# Patient Record
Sex: Male | Born: 1970 | Race: White | Hispanic: No | Marital: Married | Smoking: Former smoker
Health system: Southern US, Community
[De-identification: ages and names within clinical notes are randomized; demographics above are authoritative.]

## PROBLEM LIST (undated history)

## (undated) DIAGNOSIS — M779 Enthesopathy, unspecified: Secondary | ICD-10-CM

## (undated) DIAGNOSIS — Z973 Presence of spectacles and contact lenses: Secondary | ICD-10-CM

## (undated) HISTORY — PX: WISDOM TOOTH EXTRACTION: SHX21

---

## 1997-01-15 HISTORY — PX: KNEE ARTHROSCOPY: SUR90

## 2011-05-22 ENCOUNTER — Other Ambulatory Visit: Payer: Self-pay | Admitting: Emergency Medicine

## 2011-05-22 ENCOUNTER — Ambulatory Visit
Admission: RE | Admit: 2011-05-22 | Discharge: 2011-05-22 | Disposition: A | Discharge status: Home / Self Care | Payer: Managed Care, Other (non HMO) | Source: Ambulatory Visit | Attending: Emergency Medicine | Admitting: Emergency Medicine

## 2011-05-22 DIAGNOSIS — R52 Pain, unspecified: Secondary | ICD-10-CM

## 2013-04-11 IMAGING — CR DG HIP COMPLETE 2+V*R*
2 series · 2 of 2 positions shown · non-contrast
Comparison: None.

CLINICAL DATA: Right groin and hip pain, no injury

RIGHT HIP - COMPLETE 2+ VIEW

[view not recorded (1 of 2)]
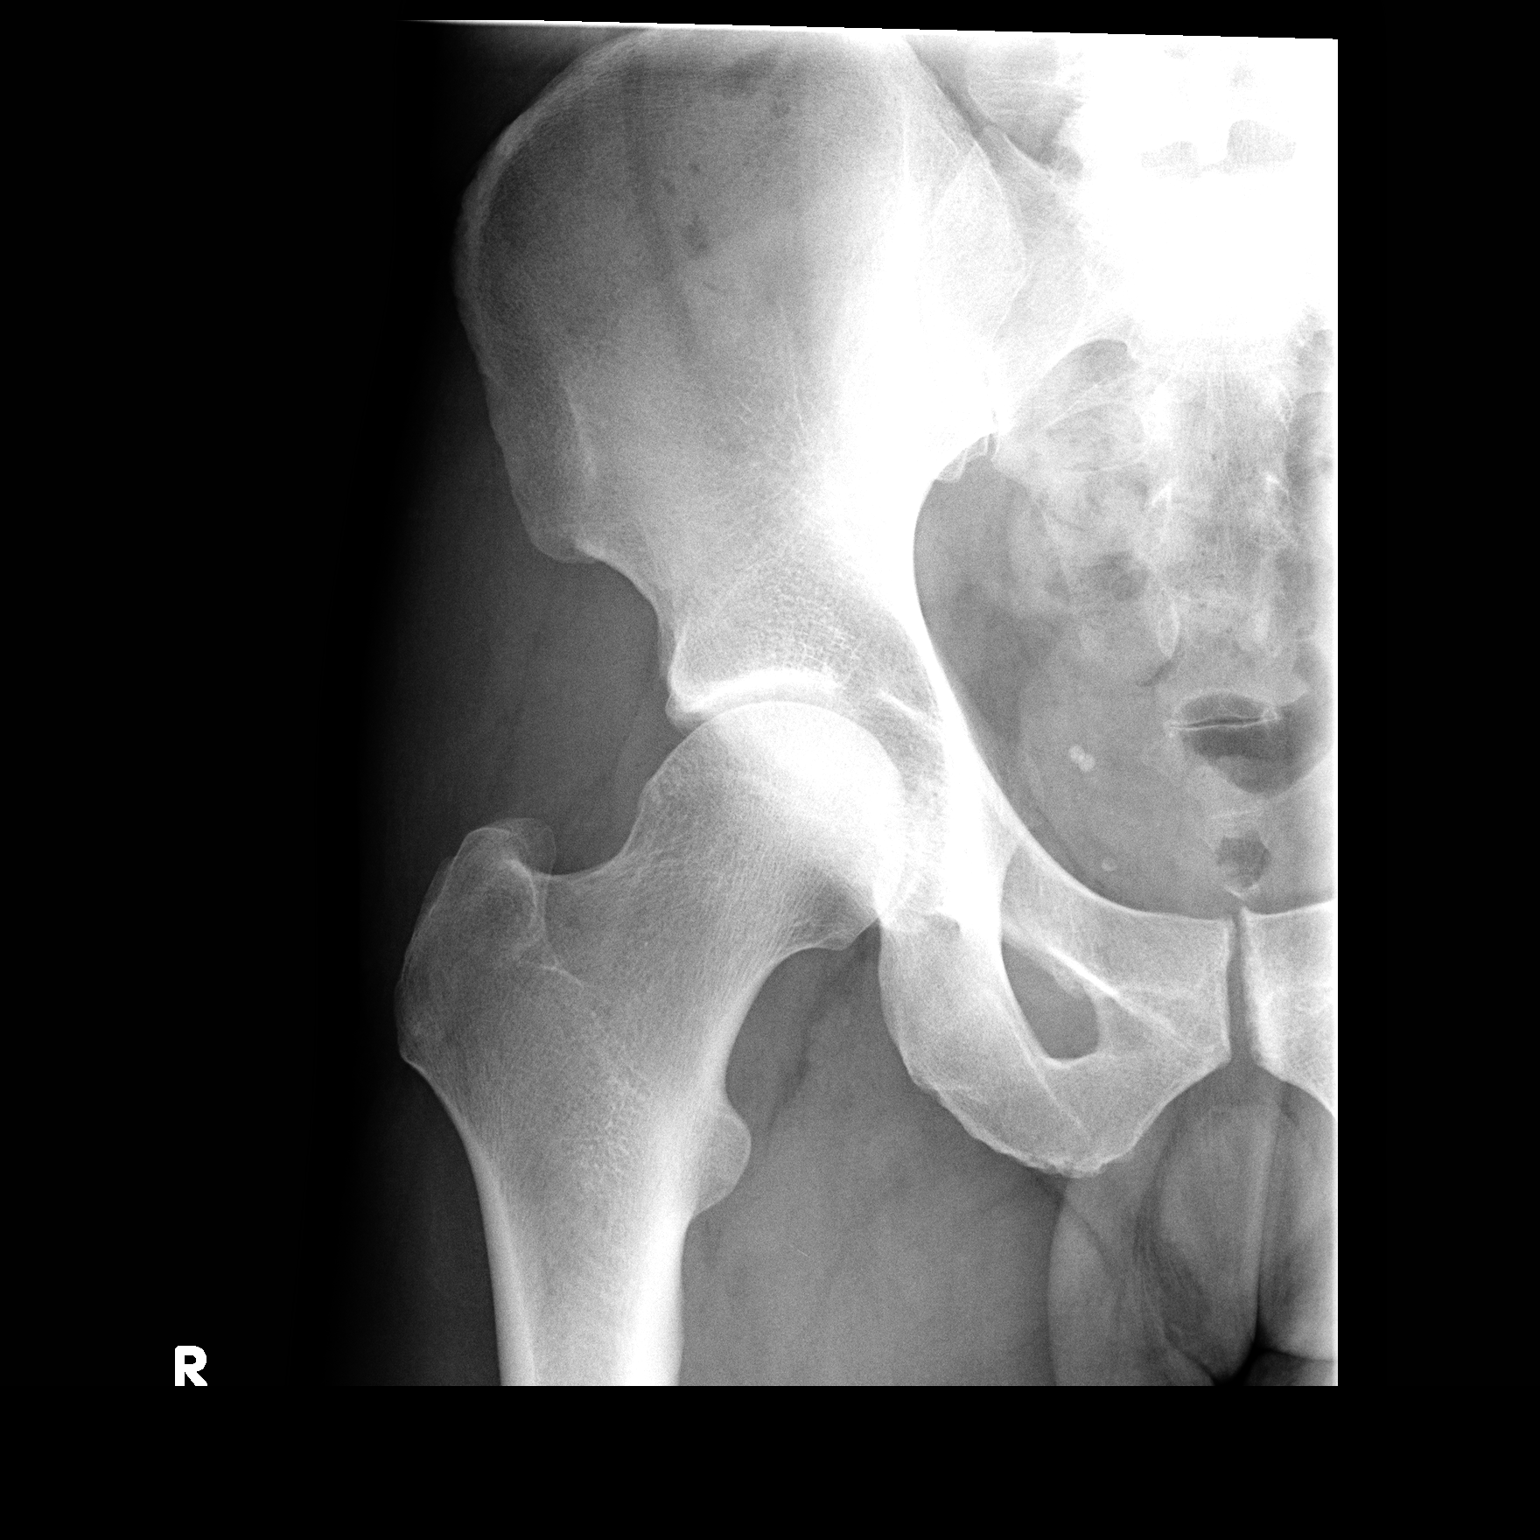

[view not recorded (2 of 2)]
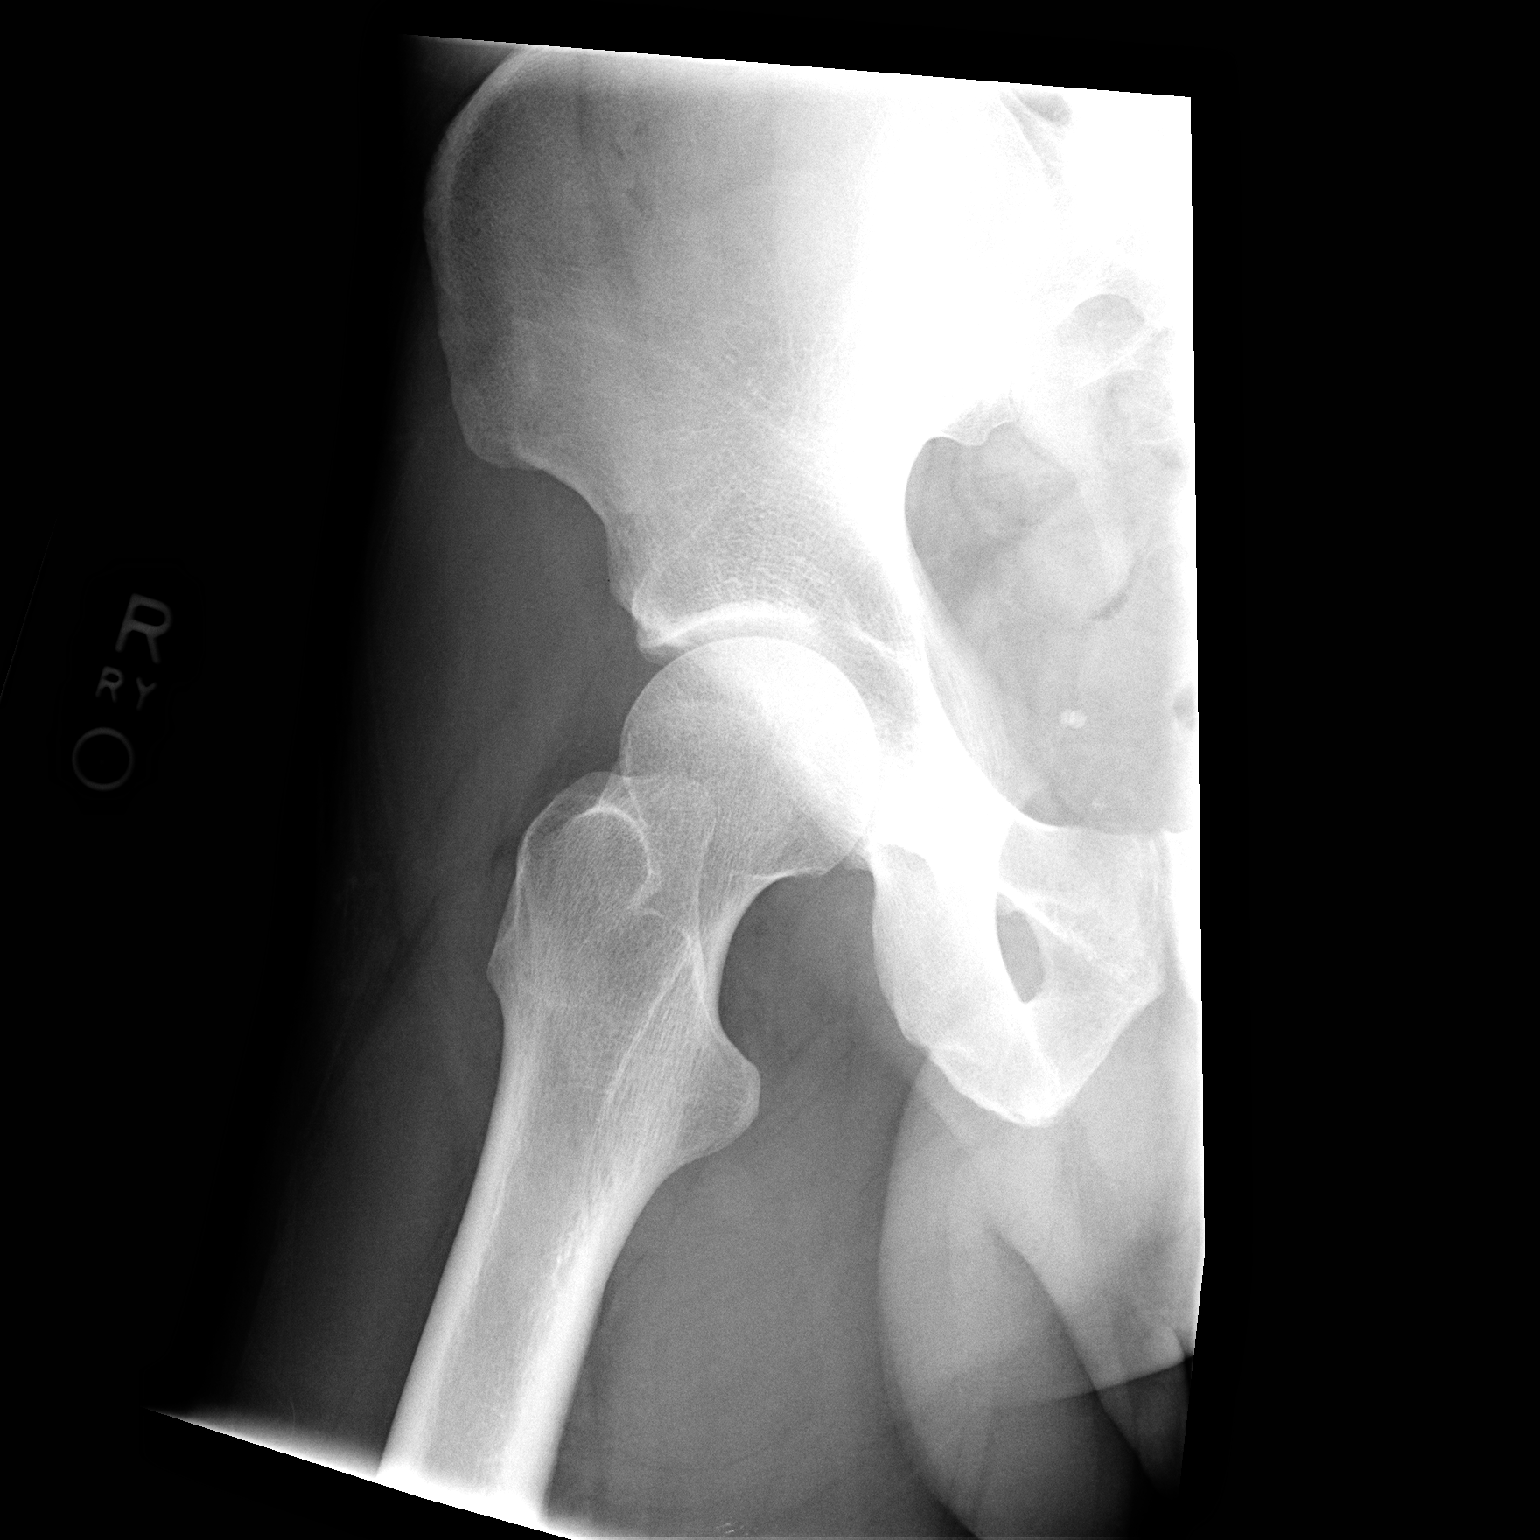

[2 of 2 positions shown; findings below may reference images not displayed]

FINDINGS: No acute bony abnormality is seen.  The right hip joint
space is relatively normal.  The right ramus is intact.  The right
SI joint appears normal.
IMPRESSION: Negative.

## 2015-11-16 DIAGNOSIS — R198 Other specified symptoms and signs involving the digestive system and abdomen: Secondary | ICD-10-CM | POA: Insufficient documentation

## 2015-11-16 DIAGNOSIS — R09A2 Foreign body sensation, throat: Secondary | ICD-10-CM | POA: Insufficient documentation

## 2015-11-16 DIAGNOSIS — R0989 Other specified symptoms and signs involving the circulatory and respiratory systems: Secondary | ICD-10-CM | POA: Insufficient documentation

## 2018-11-20 ENCOUNTER — Other Ambulatory Visit (HOSPITAL_COMMUNITY)
Admission: RE | Admit: 2018-11-20 | Discharge: 2018-11-20 | Disposition: A | Discharge status: Home / Self Care | Payer: Commercial Managed Care - PPO | Source: Ambulatory Visit | Attending: Podiatry | Admitting: Podiatry

## 2018-11-20 DIAGNOSIS — Z01812 Encounter for preprocedural laboratory examination: Secondary | ICD-10-CM | POA: Insufficient documentation

## 2018-11-20 DIAGNOSIS — Z20828 Contact with and (suspected) exposure to other viral communicable diseases: Secondary | ICD-10-CM | POA: Insufficient documentation

## 2018-11-21 ENCOUNTER — Encounter (HOSPITAL_BASED_OUTPATIENT_CLINIC_OR_DEPARTMENT_OTHER): Payer: Self-pay | Admitting: *Deleted

## 2018-11-21 ENCOUNTER — Other Ambulatory Visit: Payer: Self-pay

## 2018-11-21 NOTE — Progress Notes (Signed)
Spoke w/ via phone for pre-op interview--- PT Lab needs dos---- NONE COVID test ------ 11-20-2018 Arrive at ------- 1330 NPO after ------ MN w/ exception clear liquids until 0930 (no cream/ milk products) then nothing by mouth Medications to take morning of surgery ----- NONE Diabetic medication ----- n/a Patient Special Instructions ----- n/a  Pre-Op special Istructions -----  Have not received pt's pcp h&p yet from dr Corky Crafts office.  Pt stated he called [facility] in [facility] he [facility], stated they are waiting for lab results before faxing h&p to dr Mollie Germany and it could be today or Saturday.  When it comes will get to surgery center to place on chat.  Patient verbalized understanding of instructions that were given at this phone interview. Patient denies shortness of breath, chest pain, fever, cough a this phone interview.

## 2018-11-21 NOTE — Progress Notes (Signed)
Dr. Fritzi Mandes to modify consent order due to abbreviations.

## 2018-11-22 LAB — NOVEL CORONAVIRUS, NAA (HOSP ORDER, SEND-OUT TO REF LAB; TAT 18-24 HRS): SARS-CoV-2, NAA: NOT DETECTED

## 2018-11-24 ENCOUNTER — Encounter (HOSPITAL_BASED_OUTPATIENT_CLINIC_OR_DEPARTMENT_OTHER): Payer: Self-pay | Admitting: *Deleted

## 2018-11-24 ENCOUNTER — Ambulatory Visit (HOSPITAL_BASED_OUTPATIENT_CLINIC_OR_DEPARTMENT_OTHER): Payer: Commercial Managed Care - PPO | Admitting: Anesthesiology

## 2018-11-24 ENCOUNTER — Other Ambulatory Visit: Payer: Self-pay

## 2018-11-24 ENCOUNTER — Ambulatory Visit (HOSPITAL_BASED_OUTPATIENT_CLINIC_OR_DEPARTMENT_OTHER)
Admission: RE | Admit: 2018-11-24 | Discharge: 2018-11-24 | Disposition: A | Discharge status: Home / Self Care | Payer: Commercial Managed Care - PPO | Source: Ambulatory Visit | Attending: Podiatry | Admitting: Podiatry

## 2018-11-24 ENCOUNTER — Encounter (HOSPITAL_BASED_OUTPATIENT_CLINIC_OR_DEPARTMENT_OTHER)
Admission: RE | Disposition: A | Discharge status: Home / Self Care | Payer: Self-pay | Source: Ambulatory Visit | Attending: Podiatry

## 2018-11-24 DIAGNOSIS — Z79899 Other long term (current) drug therapy: Secondary | ICD-10-CM | POA: Insufficient documentation

## 2018-11-24 DIAGNOSIS — M205X1 Other deformities of toe(s) (acquired), right foot: Secondary | ICD-10-CM | POA: Diagnosis present

## 2018-11-24 DIAGNOSIS — Z87891 Personal history of nicotine dependence: Secondary | ICD-10-CM | POA: Diagnosis not present

## 2018-11-24 DIAGNOSIS — M7751 Other enthesopathy of right foot: Secondary | ICD-10-CM | POA: Diagnosis not present

## 2018-11-24 DIAGNOSIS — M25571 Pain in right ankle and joints of right foot: Secondary | ICD-10-CM | POA: Insufficient documentation

## 2018-11-24 HISTORY — DX: Presence of spectacles and contact lenses: Z97.3

## 2018-11-24 HISTORY — DX: Enthesopathy, unspecified: M77.9

## 2018-11-24 HISTORY — PX: CHILECTOMY: SHX6287

## 2018-11-24 SURGERY — CHILECTOMY
Anesthesia: Monitor Anesthesia Care | Site: Foot | Laterality: Right

## 2018-11-24 MED ORDER — OXYCODONE HCL 5 MG/5ML PO SOLN
5.0000 mg | Freq: Once | ORAL | Status: DC | PRN
  Filled 2018-11-24: qty 5

## 2018-11-24 MED ORDER — MIDAZOLAM HCL 2 MG/2ML IJ SOLN
INTRAMUSCULAR | Status: AC
  Filled 2018-11-24: qty 2

## 2018-11-24 MED ORDER — PROPOFOL 10 MG/ML IV BOLUS
INTRAVENOUS | Status: AC
  Filled 2018-11-24: qty 20

## 2018-11-24 MED ORDER — PROPOFOL 10 MG/ML IV BOLUS
INTRAVENOUS | Status: DC | PRN
  Administered 2018-11-24: 20 mg via INTRAVENOUS
  Administered 2018-11-24: 50 mg via INTRAVENOUS
  Administered 2018-11-24: 30 mg via INTRAVENOUS

## 2018-11-24 MED ORDER — CHLORHEXIDINE GLUCONATE CLOTH 2 % EX PADS
6.0000 | MEDICATED_PAD | Freq: Once | CUTANEOUS | Status: DC
  Filled 2018-11-24: qty 6

## 2018-11-24 MED ORDER — ONDANSETRON HCL 4 MG/2ML IJ SOLN
INTRAMUSCULAR | Status: DC | PRN
  Administered 2018-11-24: 4 mg via INTRAVENOUS

## 2018-11-24 MED ORDER — MIDAZOLAM HCL 2 MG/2ML IJ SOLN
2.0000 mg | Freq: Once | INTRAMUSCULAR | Status: AC
  Administered 2018-11-24: 2 mg via INTRAVENOUS
  Filled 2018-11-24: qty 2

## 2018-11-24 MED ORDER — MIDAZOLAM HCL 2 MG/2ML IJ SOLN
INTRAMUSCULAR | Status: DC | PRN
  Administered 2018-11-24: 2 mg via INTRAVENOUS

## 2018-11-24 MED ORDER — FENTANYL CITRATE (PF) 100 MCG/2ML IJ SOLN
100.0000 ug | Freq: Once | INTRAMUSCULAR | Status: AC
  Administered 2018-11-24: 14:00:00 100 ug via INTRAVENOUS
  Filled 2018-11-24: qty 2

## 2018-11-24 MED ORDER — OXYCODONE HCL 5 MG PO TABS
5.0000 mg | ORAL_TABLET | Freq: Once | ORAL | Status: DC | PRN
  Filled 2018-11-24: qty 1

## 2018-11-24 MED ORDER — PROMETHAZINE HCL 12.5 MG PO TABS
12.5000 mg | ORAL_TABLET | ORAL | Status: DC | PRN
  Filled 2018-11-24: qty 1

## 2018-11-24 MED ORDER — PROMETHAZINE HCL 25 MG/ML IJ SOLN
6.2500 mg | INTRAMUSCULAR | Status: DC | PRN
  Filled 2018-11-24: qty 1

## 2018-11-24 MED ORDER — PROPOFOL 500 MG/50ML IV EMUL
INTRAVENOUS | Status: DC | PRN
  Administered 2018-11-24: 100 ug/kg/min via INTRAVENOUS

## 2018-11-24 MED ORDER — DEXAMETHASONE SODIUM PHOSPHATE 10 MG/ML IJ SOLN
INTRAMUSCULAR | Status: DC | PRN
  Administered 2018-11-24: 4 mg via INTRAVENOUS

## 2018-11-24 MED ORDER — CEFAZOLIN SODIUM-DEXTROSE 2-4 GM/100ML-% IV SOLN
2.0000 g | INTRAVENOUS | Status: DC
  Filled 2018-11-24: qty 100

## 2018-11-24 MED ORDER — ACETAMINOPHEN 325 MG PO TABS
650.0000 mg | ORAL_TABLET | ORAL | Status: DC | PRN
  Filled 2018-11-24: qty 2

## 2018-11-24 MED ORDER — ACETAMINOPHEN 500 MG PO TABS
1000.0000 mg | ORAL_TABLET | Freq: Once | ORAL | Status: DC
  Filled 2018-11-24: qty 2

## 2018-11-24 MED ORDER — HYDROCODONE-ACETAMINOPHEN 5-325 MG PO TABS
1.0000 | ORAL_TABLET | ORAL | Status: DC | PRN
  Filled 2018-11-24: qty 1

## 2018-11-24 MED ORDER — FENTANYL CITRATE (PF) 100 MCG/2ML IJ SOLN
INTRAMUSCULAR | Status: AC
  Filled 2018-11-24: qty 2

## 2018-11-24 MED ORDER — SODIUM CHLORIDE 0.9% FLUSH
3.0000 mL | Freq: Two times a day (BID) | INTRAVENOUS | Status: DC
  Filled 2018-11-24: qty 3

## 2018-11-24 MED ORDER — FENTANYL CITRATE (PF) 100 MCG/2ML IJ SOLN
25.0000 ug | INTRAMUSCULAR | Status: DC | PRN
  Filled 2018-11-24: qty 1

## 2018-11-24 MED ORDER — CEFAZOLIN SODIUM-DEXTROSE 2-4 GM/100ML-% IV SOLN
2.0000 g | INTRAVENOUS | Status: AC
  Administered 2018-11-24: 2 g via INTRAVENOUS
  Filled 2018-11-24: qty 100

## 2018-11-24 MED ORDER — FENTANYL CITRATE (PF) 100 MCG/2ML IJ SOLN
INTRAMUSCULAR | Status: DC | PRN
  Administered 2018-11-24 (×2): 25 ug via INTRAVENOUS

## 2018-11-24 MED ORDER — LACTATED RINGERS IV SOLN
INTRAVENOUS | Status: DC
  Administered 2018-11-24 (×2): via INTRAVENOUS
  Filled 2018-11-24: qty 1000

## 2018-11-24 MED ORDER — CEFAZOLIN SODIUM-DEXTROSE 2-4 GM/100ML-% IV SOLN
INTRAVENOUS | Status: AC
  Filled 2018-11-24: qty 100

## 2018-11-24 MED ORDER — SODIUM CHLORIDE 0.9% FLUSH
3.0000 mL | INTRAVENOUS | Status: DC | PRN
  Filled 2018-11-24: qty 3

## 2018-11-24 MED ORDER — BUPIVACAINE HCL (PF) 0.5 % IJ SOLN
INTRAMUSCULAR | Status: DC | PRN
  Administered 2018-11-24: 23 mL via PERINEURAL

## 2018-11-24 MED ORDER — BUPIVACAINE LIPOSOME 1.3 % IJ SUSP
INTRAMUSCULAR | Status: DC | PRN
  Administered 2018-11-24: 10 mL

## 2018-11-24 SURGICAL SUPPLY — 62 items
BANDAGE CO FLEX L/F 2IN X 5YD (GAUZE/BANDAGES/DRESSINGS) ×3 IMPLANT
BANDAGE ELASTIC 6 VELCRO ST LF (GAUZE/BANDAGES/DRESSINGS) IMPLANT
BENZOIN TINCTURE PRP APPL 2/3 (GAUZE/BANDAGES/DRESSINGS) ×3 IMPLANT
BLADE AVERAGE 25MMX9MM (BLADE) ×1
BLADE AVERAGE 25X9 (BLADE) ×2 IMPLANT
BLADE OSC/SAG .038X5.5 CUT EDG (BLADE) IMPLANT
BLADE OSC/SAGITTAL MD 5.5X18 (BLADE) IMPLANT
BLADE OSCIL/SAGITTAL W/10 ST (BLADE) IMPLANT
BLADE OSCIL/SAGITTAL W/10MM ST (BLADE)
BLADE SURG 15 STRL LF DISP TIS (BLADE) ×3 IMPLANT
BLADE SURG 15 STRL SS (BLADE) ×6
BNDG COHESIVE 2X5 TAN NS LF (GAUZE/BANDAGES/DRESSINGS) ×2 IMPLANT
BNDG CONFORM 2 STRL LF (GAUZE/BANDAGES/DRESSINGS) ×3 IMPLANT
BNDG ESMARK 4X9 LF (GAUZE/BANDAGES/DRESSINGS) ×3 IMPLANT
CLOSURE WOUND 1/4 X3 (GAUZE/BANDAGES/DRESSINGS) ×1
CLOSURE WOUND 1/4X4 (GAUZE/BANDAGES/DRESSINGS) ×1
COVER BACK TABLE 60X90IN (DRAPES) ×3 IMPLANT
COVER MAYO STAND STRL (DRAPES) ×3 IMPLANT
COVER WAND RF STERILE (DRAPES) IMPLANT
DRAPE EXTREMITY T 121X128X90 (DISPOSABLE) ×3 IMPLANT
DRAPE SHEET LG 3/4 BI-LAMINATE (DRAPES) ×3 IMPLANT
DURAPREP 26ML APPLICATOR (WOUND CARE) ×3 IMPLANT
ELECT REM PT RETURN 9FT ADLT (ELECTROSURGICAL) ×3
ELECTRODE REM PT RTRN 9FT ADLT (ELECTROSURGICAL) ×1 IMPLANT
GAUZE SPONGE 4X4 12PLY STRL (GAUZE/BANDAGES/DRESSINGS) ×3 IMPLANT
GAUZE SPONGE 4X4 12PLY STRL LF (GAUZE/BANDAGES/DRESSINGS) ×3 IMPLANT
GAUZE XEROFORM 1X8 LF (GAUZE/BANDAGES/DRESSINGS) ×3 IMPLANT
GLOVE BIO SURGEON STRL SZ7.5 (GLOVE) ×3 IMPLANT
GLOVE INDICATOR 7.5 STRL GRN (GLOVE) ×3 IMPLANT
GOWN STRL REUS W/TWL LRG LVL3 (GOWN DISPOSABLE) ×3 IMPLANT
KIT TURNOVER CYSTO (KITS) ×3 IMPLANT
LOOP VESSEL MAXI BLUE (MISCELLANEOUS) IMPLANT
NDL SAFETY ECLIPSE 18X1.5 (NEEDLE) ×1 IMPLANT
NEEDLE 27GAX1X1/2 (NEEDLE) ×3 IMPLANT
NEEDLE HYPO 18GX1.5 SHARP (NEEDLE) ×2
NEEDLE HYPO 22GX1.5 SAFETY (NEEDLE) ×3 IMPLANT
NS IRRIG 500ML POUR BTL (IV SOLUTION) ×3 IMPLANT
PACK BASIN DAY SURGERY FS (CUSTOM PROCEDURE TRAY) ×3 IMPLANT
PADDING CAST ABS 4INX4YD NS (CAST SUPPLIES)
PADDING CAST ABS COTTON 4X4 ST (CAST SUPPLIES) IMPLANT
PENCIL BUTTON HOLSTER BLD 10FT (ELECTRODE) ×3 IMPLANT
RASP BONE SMALL TEAR RASP (RASP) ×3 IMPLANT
SPONGE LAP 4X18 RFD (DISPOSABLE) ×3 IMPLANT
STOCKINETTE 6  STRL (DRAPES) ×2
STOCKINETTE 6 STRL (DRAPES) ×1 IMPLANT
STRIP CLOSURE SKIN 1/4X3 (GAUZE/BANDAGES/DRESSINGS) ×2 IMPLANT
STRIP CLOSURE SKIN 1/4X4 (GAUZE/BANDAGES/DRESSINGS) ×2 IMPLANT
SUCTION FRAZIER HANDLE 10FR (MISCELLANEOUS) ×2
SUCTION TUBE FRAZIER 10FR DISP (MISCELLANEOUS) ×1 IMPLANT
SUT ETHILON 4 0 PS 2 18 (SUTURE) IMPLANT
SUT ETHILON 5 0 PS 2 18 (SUTURE) IMPLANT
SUT MNCRL AB 4-0 PS2 18 (SUTURE) ×3 IMPLANT
SUT VIC AB 3-0 SH 27 (SUTURE) ×2
SUT VIC AB 3-0 SH 27X BRD (SUTURE) ×1 IMPLANT
SUT VICRYL 4-0 PS2 18IN ABS (SUTURE) ×3 IMPLANT
SYR 3ML 18GX1 1/2 (SYRINGE) ×3 IMPLANT
SYR BULB 3OZ (MISCELLANEOUS) ×3 IMPLANT
SYR CONTROL 10ML LL (SYRINGE) ×6 IMPLANT
TUBE CONNECTING 12'X1/4 (SUCTIONS) ×1
TUBE CONNECTING 12X1/4 (SUCTIONS) ×2 IMPLANT
UNDERPAD 30X30 (UNDERPADS AND DIAPERS) ×3 IMPLANT
WATER STERILE IRR 500ML POUR (IV SOLUTION) ×3 IMPLANT

## 2018-11-24 NOTE — Discharge Instructions (Signed)
°  Post Anesthesia Home Care Instructions  Activity: Get plenty of rest for the remainder of the day. A responsible individual must stay with you for 24 hours following the procedure.  For the next 24 hours, DO NOT: -Drive a car -Paediatric nurse -Drink alcoholic beverages -Take any medication unless instructed by your physician -Make any legal decisions or sign important papers.  Meals: Start with liquid foods such as gelatin or soup. Progress to regular foods as tolerated. Avoid greasy, spicy, heavy foods. If nausea and/or vomiting occur, drink only clear liquids until the nausea and/or vomiting subsides. Call your physician if vomiting continues.  Special Instructions/Symptoms: Your throat may feel dry or sore from the anesthesia or the breathing tube placed in your throat during surgery. If this causes discomfort, gargle with warm salt water. The discomfort should disappear within 24 hours.  Regional Anesthesia Blocks  1. Numbness or the inability to move the "blocked" extremity may last from 3-48 hours after placement. The length of time depends on the medication injected and your individual response to the medication. If the numbness is not going away after 48 hours, call your surgeon.  2. The extremity that is blocked will need to be protected until the numbness is gone and the  Strength has returned. Because you cannot feel it, you will need to take extra care to avoid injury. Because it may be weak, you may have difficulty moving it or using it. You may not know what position it is in without looking at it while the block is in effect.  3. For blocks in the legs and feet, returning to weight bearing and walking needs to be done carefully. You will need to wait until the numbness is entirely gone and the strength has returned. You should be able to move your leg and foot normally before you try and bear weight or walk. You will need someone to be with you when you first try to ensure  you do not fall and possibly risk injury.  4. Bruising and tenderness at the needle site are common side effects and will resolve in a few days.  5. Persistent numbness or new problems with movement should be communicated to the surgeon or the [place] [phone] [place] [phone]).    Do not remove surgical dressing. Reinforce surgical dressing PRN with Kerlix.   Elevate the operative extremity on two pillows at all times.   Non weight bearing lower extremity using a walker or crutches

## 2018-11-24 NOTE — Progress Notes (Signed)
Assisted Dr. Howze with right, ultrasound guided, ankle block. Side rails up, monitors on throughout procedure. See vital signs in flow sheet. Tolerated Procedure well. 

## 2018-11-24 NOTE — Pre-Procedure Instructions (Signed)
Procedure reviewed with the patient.  There is no contraindication to surgery at this time.  All questions answered.  No guarantees given.

## 2018-11-24 NOTE — Anesthesia Procedure Notes (Signed)
Anesthesia Regional Block: Ankle block   Pre-Anesthetic Checklist: ,, timeout performed, Correct Patient, Correct Site, Correct Laterality, Correct Procedure, Correct Position, site marked, Risks and benefits discussed, pre-op evaluation,  At surgeon's request and post-op pain management  Laterality: Right  Prep: Maximum Sterile Barrier Precautions used, chloraprep       Needles:  Injection technique: Single-shot  Needle Type: Echogenic Needle     Needle Length: 4cm  Needle Gauge: 25     Additional Needles:   Narrative:  Start time: 11/24/2018 2:27 PM End time: 11/24/2018 2:30 PM  Performed by: Personally  Anesthesiologist: Brennan Bailey, MD  Additional Notes: Risks, benefits, and alternative discussed. Patient gave consent for procedure. Patient prepped and draped in sterile fashion. Sedation administered, patient remains easily responsive to voice. Local anesthetic given in 5cc increments with no signs or symptoms of intravascular injection. No pain or paraesthesias with injection. Patient monitored throughout procedure with signs of LAST or immediate complications. Tolerated well.   Tawny Asal, MD

## 2018-11-24 NOTE — Anesthesia Preprocedure Evaluation (Addendum)
Anesthesia Evaluation  Patient identified by MRN, date of birth, ID band Patient awake    Reviewed: Allergy & Precautions, NPO status , Patient's Chart, lab work & pertinent test results  History of Anesthesia Complications Negative for: history of anesthetic complications  Airway Mallampati: II  TM Distance: >3 FB Neck ROM: Full    Dental no notable dental hx.    Pulmonary former smoker,    Pulmonary exam normal        Cardiovascular negative cardio ROS Normal cardiovascular exam     Neuro/Psych negative neurological ROS  negative psych ROS   GI/Hepatic negative GI ROS, Neg liver ROS,   Endo/Other  negative endocrine ROS  Renal/GU negative Renal ROS  negative genitourinary   Musculoskeletal negative musculoskeletal ROS (+)   Abdominal   Peds  Hematology negative hematology ROS (+)   Anesthesia Other Findings Day of surgery medications reviewed with patient.  Reproductive/Obstetrics negative OB ROS                            Anesthesia Physical Anesthesia Plan  ASA: I  Anesthesia Plan: Regional and MAC   Post-op Pain Management:    Induction:   PONV Risk Score and Plan: 1 and Treatment may vary due to age or medical condition, Propofol infusion, Ondansetron and Midazolam  Airway Management Planned: Natural Airway and Nasal Cannula  Additional Equipment: None  Intra-op Plan:   Post-operative Plan:   Informed Consent: I have reviewed the patients History and Physical, chart, labs and discussed the procedure including the risks, benefits and alternatives for the proposed anesthesia with the patient or authorized representative who has indicated his/her understanding and acceptance.       Plan Discussed with: CRNA  Anesthesia Plan Comments:       Anesthesia Quick Evaluation

## 2018-11-24 NOTE — Anesthesia Postprocedure Evaluation (Signed)
Anesthesia Post Note  Patient: Alexander Cruz  Procedure(s) Performed: CHILECTOMY (Right Foot)     Patient location during evaluation: PACU Anesthesia Type: Regional and MAC Level of consciousness: awake and alert Pain management: pain level controlled Vital Signs Assessment: post-procedure vital signs reviewed and stable Respiratory status: spontaneous breathing, nonlabored ventilation and respiratory function stable Cardiovascular status: stable and blood pressure returned to baseline Postop Assessment: no apparent nausea or vomiting Anesthetic complications: no    Last Vitals:  Vitals:   11/24/18 1645 11/24/18 1700  BP: 121/86 118/82  Pulse: 66 76  Resp: 14 14  Temp:    SpO2: 97% 98%    Last Pain:  Vitals:   11/24/18 1645  TempSrc:   PainSc: 0-No pain                 Eowyn Tabone,W. EDMOND

## 2018-11-24 NOTE — Anesthesia Procedure Notes (Signed)
Procedure Name: MAC Date/Time: 11/24/2018 3:43 PM Performed by: Justice Rocher, CRNA Pre-anesthesia Checklist: Patient identified, Emergency Drugs available, Suction available, Patient being monitored and Timeout performed Patient Re-evaluated:Patient Re-evaluated prior to induction Oxygen Delivery Method: Simple face mask Preoxygenation: Pre-oxygenation with 100% oxygen Induction Type: IV induction Placement Confirmation: breath sounds checked- equal and bilateral and positive ETCO2

## 2018-11-24 NOTE — Anesthesia Procedure Notes (Signed)
Procedure Name: MAC Date/Time: 11/24/2018 3:11 PM Performed by: Justice Rocher, CRNA Pre-anesthesia Checklist: Patient identified, Emergency Drugs available, Suction available, Patient being monitored and Timeout performed Patient Re-evaluated:Patient Re-evaluated prior to induction Oxygen Delivery Method: Nasal cannula Preoxygenation: Pre-oxygenation with 100% oxygen Induction Type: IV induction Placement Confirmation: breath sounds checked- equal and bilateral and positive ETCO2

## 2018-11-24 NOTE — Transfer of Care (Signed)
  Last Vitals:  Vitals Value Taken Time  BP    Temp    Pulse 80 11/24/18 1625  Resp 17 11/24/18 1625  SpO2 100 % 11/24/18 1625  Vitals shown include unvalidated device data.  Last Pain:  Vitals:   11/24/18 1258  TempSrc: Oral         Immediate Anesthesia Transfer of Care Note  Patient: Alexander Cruz  Procedure(s) Performed: Procedure(s) (LRB): CHILECTOMY (Right)  Patient Location: PACU  Anesthesia Type: General  Level of Consciousness: awake, alert  and oriented  Airway & Oxygen Therapy: Patient Spontanous Breathing and Patient connected to face mask oxygen  Post-op Assessment: Report given to PACU RN and Post -op Vital signs reviewed and stable  Post vital signs: Reviewed and stable  Complications: No apparent anesthesia complications

## 2018-11-25 ENCOUNTER — Encounter (HOSPITAL_BASED_OUTPATIENT_CLINIC_OR_DEPARTMENT_OTHER): Payer: Self-pay | Admitting: Podiatry

## 2018-11-25 NOTE — Op Note (Signed)
Alexander Cruz, HOEFFNER MEDICAL RECORD [id number] ACCOUNT [id number] DATE OF BIRTH:September 07, 1970 FACILITY: [facility] LOCATION: [facility] PHYSICIAN:Donisha Hoch, DPM  OPERATIVE REPORT  DATE OF PROCEDURE:  11/24/2018  SURGEON:  Rosemary Holms, DPM  ASSISTANT:  None.  PREOPERATIVE DIAGNOSIS:  Hallux limitus, right foot.  POSTOPERATIVE DIAGNOSIS:  Hallux limitus, right foot.  PROCEDURE:  Cheilectomy, right foot.  ANESTHESIA:  MAC with local.  COMPLICATIONS:  None.  DESCRIPTION OF PROCEDURE:  The patient was brought to the OR and placed in the supine position, at which time monitored anesthesia care was administered.  An ankle block was performed preoperatively by the anesthesiologist.  A well-padded pneumatic ankle  tourniquet was applied superior to the medial malleolus.  The patient was prepped and draped in the usual aseptic manner.  The foot was extenuated with an Esmarch bandage and the previously applied tourniquet inflated to 250 mmHg.  Attention was  directed to the first ray where a dorsal linear incision was made.  The incision was deepened via sharp and blunt modalities, taking care to clamp and cauterize all bleeding vessels and ensuring retraction of neurovascular structures encountered.  Deep  and superficial fascia was separated medially and laterally the length of the incision.  A linear capsulotomy was performed and the capsule freed from the head of the first metatarsal and base of the proximal phalanx.  At that time, a joint mouse was  identified, which I was able to excise.  There was a moderate amount of hypertrophic bone lipping at the head of the first metatarsal and base of the proximal phalanx, which was all resected using a power saw.  The medial eminence was resected using a  power saw.  The first MTP joint was evaluated.  There was a moderate amount of erosion in approximately the dorsal third of the head of the first metatarsal had erosion and a 0.045 K-wire was used  to fenestrate the erosion.  All rough edges were smoothed  with a power rasp.  The area was irrigated with copious amounts of sterile saline and antibiotic solution.  Deep closure was accomplished with 3-0 and 4-0 Vicryl and skin closure was accomplished with 4-0 Monocryl in a running subcuticular fashion.  The  foot was dressed with Steri-Strips, Xeroform, 4 x 4's, Kling and Coban.  The tourniquet was deflated and vascular status returned to all digits.  The patient was sent to the recovery room with vital signs stable and capillary refill time presurgical  levels.  Both written and oral postoperative instructions were given to the patient.  No guarantees given.  TN/NUANCE  D:11/24/2018 T:11/25/2018 JOB:[id number]

## 2020-07-07 ENCOUNTER — Ambulatory Visit (INDEPENDENT_AMBULATORY_CARE_PROVIDER_SITE_OTHER): Payer: Commercial Managed Care - PPO

## 2020-07-07 ENCOUNTER — Other Ambulatory Visit: Payer: Self-pay

## 2020-07-07 ENCOUNTER — Ambulatory Visit: Payer: Commercial Managed Care - PPO | Admitting: Podiatry

## 2020-07-07 DIAGNOSIS — T7840XA Allergy, unspecified, initial encounter: Secondary | ICD-10-CM | POA: Insufficient documentation

## 2020-07-07 DIAGNOSIS — M205X1 Other deformities of toe(s) (acquired), right foot: Secondary | ICD-10-CM

## 2020-07-07 DIAGNOSIS — M778 Other enthesopathies, not elsewhere classified: Secondary | ICD-10-CM

## 2020-07-07 DIAGNOSIS — F419 Anxiety disorder, unspecified: Secondary | ICD-10-CM | POA: Insufficient documentation

## 2020-07-07 DIAGNOSIS — B009 Herpesviral infection, unspecified: Secondary | ICD-10-CM | POA: Insufficient documentation

## 2020-07-10 NOTE — Progress Notes (Signed)
  Subjective:  Patient ID: Alexander Cruz, male    DOB: 10/18/1970,  MRN: [id number] HPI Chief Complaint  Patient presents with   Foot Pain    Right 1st and sub 1st inflammation, pt states it reduces his range of motion and limits exercise. History of bone spur removal.   Nail Problem    Pt states nail fungus 1-5 bilateral 10 year duration.    50 y.o. male presents with the above complaint.   ROS: Denies fever chills nausea vomiting muscle aches pains calf pain back pain chest pain shortness of breath.  Past Medical History:  Diagnosis Date   Bone spur    right foot   Wears glasses    Past Surgical History:  Procedure Laterality Date   CHILECTOMY Right 11/24/2018   Procedure: CHILECTOMY;  Surgeon: Larey Dresser, DPM;  Location: [facility];  Service: Podiatry;  Laterality: Right;   KNEE ARTHROSCOPY Right 1999   WISDOM TOOTH EXTRACTION      Current Outpatient Medications:    acyclovir (ZOVIRAX) 200 MG capsule, Take 200 mg by mouth daily in the afternoon., Disp: , Rfl:    acyclovir (ZOVIRAX) 200 MG capsule, 1 tablet, Disp: , Rfl:    EPINEPHrine 0.3 mg/0.3 mL IJ SOAJ injection, See admin instructions., Disp: , Rfl:    escitalopram (LEXAPRO) 10 MG tablet, 1 tablet, Disp: , Rfl:    methocarbamol (ROBAXIN) 500 MG tablet, 1-2 tablets as needed for muscle pain, Disp: , Rfl:   Allergies  Allergen Reactions   Yellow Jacket Venom Other (See Comments)    Other   Review of Systems Objective:  There were no vitals filed for this visit.  General: Well developed, nourished, in no acute distress, alert and oriented x3   Dermatological: Skin is warm, dry and supple bilateral. Nails x 10 are well maintained; remaining integument appears unremarkable at this time. There are no open sores, no preulcerative lesions, no rash or signs of infection present.  Vascular: Dorsalis Pedis artery and Posterior Tibial artery pedal pulses are 2/4 bilateral with immedate capillary fill  time. Pedal hair growth present. No varicosities and no lower extremity edema present bilateral.   Neruologic: Grossly intact via light touch bilateral. Vibratory intact via tuning fork bilateral. Protective threshold with Semmes Wienstein monofilament intact to all pedal sites bilateral. Patellar and Achilles deep tendon reflexes 2+ bilateral. No Babinski or clonus noted bilateral.   Musculoskeletal: No gross boney pedal deformities bilateral. No pain, crepitus, or limitation noted with foot and ankle range of motion bilateral. Muscular strength 5/5 in all groups tested bilateral.  Decreased range of motion of the first metatarsophalangeal joint right foot.  No spurs on palpation.  He does however have pain on palpation of the sesamoidal apparatus.  Gait: Unassisted, Nonantalgic.    Radiographs:  Radiographs taken today demonstrate an osseously mature individual.  No acute findings are noted.  Chronic finding consisting first metatarsophalangeal joint osteoarthritic changes and obvious dorsal exostectomy condylectomy has been performed.  The joint is rectus there is joint space narrowing subchondral sclerosis no further spurring noted.  Assessment & Plan:   Assessment: Hallux limitus.  Osteoarthritis first metatarsophalangeal joint right foot.  Sesamoiditis right foot.  Plan: Discussed etiology pathology conservative versus surgical therapies.  Discussed the use of either fusion for this joint or a arthroplasty of the joint.  He understands this is amendable to it I will follow-up with him on an as-needed basis.     Alexander Ashworth T. [place], [place]

## 2021-04-25 ENCOUNTER — Ambulatory Visit (INDEPENDENT_AMBULATORY_CARE_PROVIDER_SITE_OTHER): Payer: Commercial Managed Care - PPO

## 2021-04-25 ENCOUNTER — Encounter: Payer: Self-pay | Admitting: Podiatry

## 2021-04-25 ENCOUNTER — Ambulatory Visit (INDEPENDENT_AMBULATORY_CARE_PROVIDER_SITE_OTHER): Payer: Commercial Managed Care - PPO | Admitting: Podiatry

## 2021-04-25 DIAGNOSIS — M205X1 Other deformities of toe(s) (acquired), right foot: Secondary | ICD-10-CM | POA: Diagnosis not present

## 2021-04-25 NOTE — Progress Notes (Signed)
He presents today for follow-up of his osteoarthritis first metatarsophalangeal joint of his right foot.  He states that the foot is continued to hurt and he would like to discuss joint replacement of the first metatarsal phalangeal joint.  He would also like to have a quote for the cost of the surgery anesthesia and surgery center.  He states that he is done some research on the joint replacements and feels that it would most likely be better for him than a fusion. ? ?Objective: Vital signs are stable he is alert and oriented x3.  Pulses are palpable.  Neurologic sensorium is intact muscle strength is normal right foot.  He has limited range of motion in the rectus hallux first metatarsal phalangeal joint on dorsiflexion and plantarflexion.  This has been previously corrected with a condylectomy several years ago. ? ?Radiographs taken today demonstrate similar findings as to those we saw last time with significant joint space narrowing no subchondral sclerosis eburnation and no spurring.  Sesamoids appear to be in good position and not arthritic. ? ?Assessment osteoarthritis hallux limitus first metatarsophalangeal joint of the right foot. ? ?Plan: We discussed thoroughly today surgery and the [facility] arthroplasty with single silicone implant versus other 2 piece implants or fusion.  We consented him today for a Keller implant with silicone implant.  He understands this and is amenable to it we did discuss possible postop complications which may include but not limited to postop pain bleeding swelling infection recurrence need for further surgery overcorrection under correction also digit loss limb loss of life.  Follow-up with him in the near future for surgical intervention. ?

## 2021-10-19 ENCOUNTER — Ambulatory Visit: Payer: Commercial Managed Care - PPO | Admitting: Podiatry

## 2021-10-19 DIAGNOSIS — B351 Tinea unguium: Secondary | ICD-10-CM | POA: Diagnosis not present

## 2021-10-19 MED ORDER — TERBINAFINE HCL 250 MG PO TABS: 250.0000 mg | ORAL_TABLET | Freq: Every day | ORAL | 0 refills | Status: DC

## 2021-10-19 NOTE — Progress Notes (Signed)
He presents today as interested in fungal nail treatment has been tested by a doctor here in [place] who stated that it was definitely a dermatophyte.  He has taken Lamisil in the past and states that it is helpful.  He denies any health problems.  Objective: Vital signs are stable alert oriented x3.  Pulses are palpable.  There is no erythema edema salines drainage odor mallet toe deformities are noted bilateral possibly resulting in some nail dystrophy.  Assessment: Onychomycosis.  Plan: I will go ahead and start him on the Lamisil 250 mg tablets 1 p.o. daily.  Follow-up with him in 30 days for blood work.  He is also going to bring me paperwork for the pathology.

## 2021-11-30 ENCOUNTER — Ambulatory Visit: Payer: Commercial Managed Care - PPO | Admitting: Podiatry

## 2021-11-30 ENCOUNTER — Encounter: Payer: Self-pay | Admitting: Podiatry

## 2021-11-30 DIAGNOSIS — B351 Tinea unguium: Secondary | ICD-10-CM | POA: Diagnosis not present

## 2021-11-30 MED ORDER — TERBINAFINE HCL 250 MG PO TABS: 250.0000 mg | ORAL_TABLET | Freq: Every day | ORAL | 0 refills | Status: DC

## 2021-11-30 NOTE — Progress Notes (Signed)
He presents today for follow-up of his nail fungus.  He states that he took the medication and while he was taking the medication he experienced really bad flatulence and stomach cramps.  He denies fever chills nausea body muscle aches pains calf pain back pain chest pain shortness of breath.  He does have questions today regarding Keller arthroplasty with a single silicone implant.  Objective: Vital signs are stable alert and oriented x3 no change in physical exam.  Assessment: Long-term therapy with Lamisil for onychomycosis.  Hallux limitus first metatarsophalangeal joint.  Plan: Discussed etiology pathology and surgical therapies at this point he would like to try taking the medication every other day.  We are requesting a complete metabolic panel.  Should this be abnormal we will notify him immediately.  I went ahead and wrote a prescription for 90 tablets he will take 1 tablet every other day question concerns she will notify us immediately.  Should he continue to develop gas buildup she will discontinue the medication.

## 2021-12-01 LAB — COMPREHENSIVE METABOLIC PANEL
AG Ratio: 1.6 (calc) (ref 1.0–2.5)
ALT: 13 U/L (ref 9–46)
AST: 18 U/L (ref 10–35)
Albumin: 4.3 g/dL (ref 3.6–5.1)
Alkaline phosphatase (APISO): 63 U/L (ref 35–144)
BUN: 22 mg/dL (ref 7–25)
CO2: 29 mmol/L (ref 20–32)
Calcium: 9.3 mg/dL (ref 8.6–10.3)
Chloride: 104 mmol/L (ref 98–110)
Creat: 0.99 mg/dL (ref 0.70–1.30)
Globulin: 2.7 g/dL (calc) (ref 1.9–3.7)
Glucose, Bld: 81 mg/dL (ref 65–139)
Potassium: 4.6 mmol/L (ref 3.5–5.3)
Sodium: 140 mmol/L (ref 135–146)
Total Bilirubin: 0.4 mg/dL (ref 0.2–1.2)
Total Protein: 7 g/dL (ref 6.1–8.1)

## 2022-01-01 ENCOUNTER — Telehealth: Payer: Self-pay | Admitting: *Deleted

## 2022-01-01 NOTE — Telephone Encounter (Signed)
Patient calling for lab work , explained that it was good per physician, verbalized understanding.

## 2023-10-08 ENCOUNTER — Ambulatory Visit: Admitting: Podiatry

## 2023-10-10 ENCOUNTER — Encounter: Payer: Self-pay | Admitting: Podiatry

## 2023-10-10 ENCOUNTER — Ambulatory Visit: Admitting: Podiatry

## 2023-10-10 ENCOUNTER — Ambulatory Visit (INDEPENDENT_AMBULATORY_CARE_PROVIDER_SITE_OTHER)

## 2023-10-10 DIAGNOSIS — M205X1 Other deformities of toe(s) (acquired), right foot: Secondary | ICD-10-CM | POA: Diagnosis not present

## 2023-10-10 DIAGNOSIS — M19071 Primary osteoarthritis, right ankle and foot: Secondary | ICD-10-CM | POA: Diagnosis not present

## 2023-10-10 NOTE — Progress Notes (Signed)
 He presents today after having not discussed his right first metatarsal phalangeal joint with us  for the past 2 years.  He states that I am ready to have surgery on this he says I want a joint replacement done which is something that we had discussed a while back.  He states that he hurts at some point every day there is just no way that he can continue with this even though he has had surgery from previous doctor to help clean this up and just has not improved.  Objective: Vital signs are stable he is alert and oriented x 3 reviewed his past medical history medications allergies surgery social history.  He has pain on attempted range of motion of the first metatarsal phalangeal joint with only about 10 to 15 degree range of motion dorsally and plantarly.  Only 15 degrees of excursion total.  He has no palpable spurs at this point due to his previous surgeries.  This is moderately to severely painful range of motion and on palpation.  Radiographs taken today demonstrate osseously mature foot and considerable amount of joint space loss has taken place since I x-rayed him in 2022.  He is pretty much bone to bone at this point.  Assessment hallux limitus first metatarsal phalangeal joint of the right foot  Plan: Discussed etiology pathology conservative versus surgical therapies we consented him today for [facility] arthroplasty with a single silicone implant right foot.  Answered all the questions regarding this procedure the best my ability in layman's terms.  We did discuss the possible postop complications which may include but not limited to postop pain bleeding swelling infection recurrence need for further surgery overcorrection under correction loss of digit loss limb loss of life.  He signed all 3 pages of consent form and I will follow-up with him in the near future for surgery.  He would like to have it done the week before Christmas if possible.

## 2023-10-16 ENCOUNTER — Telehealth: Payer: Self-pay | Admitting: Podiatry

## 2023-10-16 NOTE — Telephone Encounter (Signed)
 Pt left message stating he was needing to schedule surgery for his joint replacement and would like an estimate.  I returned call and pt is at the gym and I will call pt back in the morning.

## 2023-12-17 ENCOUNTER — Telehealth: Payer: Self-pay | Admitting: Podiatry

## 2023-12-17 NOTE — Telephone Encounter (Signed)
 DOS- 12/27/2023  KELLER BUNION IMPLANT RT- 71708  [facility] EFFECTIVE DATE- 01/16/2023  DEDUCTIBLE- $800 OOP- $7000 COINSURANCE- 20%  PER FAX RECEIVED FROM [facility], PRIOR AUTH FOR CPT CODE 71708 HAS BEEN APPROVED FROM 12/27/2023-03/26/2024. AUTH# [id number]

## 2023-12-26 ENCOUNTER — Other Ambulatory Visit: Payer: Self-pay | Admitting: Podiatry

## 2023-12-26 MED ORDER — OXYCODONE-ACETAMINOPHEN 10-325 MG PO TABS: 1.0000 | ORAL_TABLET | Freq: Three times a day (TID) | ORAL | 0 refills | Status: DC | PRN

## 2023-12-26 MED ORDER — CEPHALEXIN 500 MG PO CAPS: 500.0000 mg | ORAL_CAPSULE | Freq: Three times a day (TID) | ORAL | 0 refills | Status: DC

## 2023-12-26 MED ORDER — ONDANSETRON HCL 4 MG PO TABS: 4.0000 mg | ORAL_TABLET | Freq: Three times a day (TID) | ORAL | 0 refills | Status: DC | PRN

## 2023-12-27 ENCOUNTER — Other Ambulatory Visit: Payer: Self-pay

## 2023-12-27 DIAGNOSIS — M205X1 Other deformities of toe(s) (acquired), right foot: Secondary | ICD-10-CM | POA: Diagnosis not present

## 2023-12-27 MED ORDER — CEPHALEXIN 500 MG PO CAPS: 500.0000 mg | ORAL_CAPSULE | Freq: Three times a day (TID) | ORAL | 0 refills | Status: DC

## 2023-12-27 MED ORDER — OXYCODONE-ACETAMINOPHEN 10-325 MG PO TABS: 1.0000 | ORAL_TABLET | Freq: Three times a day (TID) | ORAL | 0 refills | Status: DC | PRN

## 2023-12-27 MED ORDER — ONDANSETRON HCL 4 MG PO TABS: 4.0000 mg | ORAL_TABLET | Freq: Three times a day (TID) | ORAL | 0 refills | Status: DC | PRN

## 2023-12-27 NOTE — Telephone Encounter (Signed)
 Patient requesting change of pharmacy. RX pended and sent to [facility] for sign off.

## 2023-12-27 NOTE — Addendum Note (Signed)
 Addended by: NETTA FLEETING I on: 12/27/2023 09:08 AM   Modules accepted: Orders

## 2024-01-02 ENCOUNTER — Ambulatory Visit

## 2024-01-02 ENCOUNTER — Ambulatory Visit (INDEPENDENT_AMBULATORY_CARE_PROVIDER_SITE_OTHER)

## 2024-01-02 VITALS — BP 149/93 | HR 79 | Temp 97.7°F

## 2024-01-02 DIAGNOSIS — Z0279 Encounter for issue of other medical certificate: Secondary | ICD-10-CM

## 2024-01-02 DIAGNOSIS — Z9889 Other specified postprocedural states: Secondary | ICD-10-CM

## 2024-01-02 DIAGNOSIS — M205X1 Other deformities of toe(s) (acquired), right foot: Secondary | ICD-10-CM

## 2024-01-02 NOTE — Progress Notes (Unsigned)
 Patient presents for post-op visit today, POV # 1 DOS 12/27/23 RT KELLERS BUNION IMPLANT  Saturday and Sunday were rough, really painful. The past couple of days better. I have been walking around the house without the boot on..  RN Notes: Patient did not start antibiotics, he did not pick them up from pharmacy.  Vital Signs: Today's Vitals   01/02/24 0845 01/02/24 0847  BP: (!) 160/86 (!) 149/93  Pulse: 88 79  Temp: 97.7 F (36.5 C)   TempSrc: Oral   PainSc: 1    PainLoc: Foot       Radiographs: [x]  Taken []  Not taken  Surgical Site Assessment:  - Dressing:  [x]  Minimal dry blood, intact []  Reinforced   []  Changed     -RN Notes: n/a  - Incision:  [x]  CDI (clean, dry, intact)  []  Mild erythema  []  Drainage noted   -RN Notes: moderate erythema  - Swelling:  []  None  []  Mild  [x]  Moderate   []  Significant     -RN Notes: n/a  - Bruising:  []  None  [x]  Present: 1st & 2nd toe, top of foot, plantar surface of foot.   - Sutures/Staples:  []  None [x]  Intact  []  Removed Today  [x]  Plan to remove at next visit   -Cast/Splint/Pins: [x]  None []  Intact []  Removed Today []  Plan to remove at next visit []  Replaced  -Signs of infection:  []  None  [x]  Present - Describe: redness, moderate erythema, moderate swelling.   -DME:    []  None [x]  AFW []  Surgical shoe []  Cast  []  Splint  -Walking status:  [x]  Full WB  []  Partial WB  []  NWB  -Utilizing device:  [x]  None []  Knee Scooter []  Crutches []  Wheelchair    DVT assessment:  [x]  Denies symptoms []  Chest pain/SOB []  Pain in calf/redness/warmth   Redressed DSD and ace wrap. Educated on signs of infection, proper dressing care, pain management, and weight bearing status. Patient will contact provider with any new or worsening symptoms. The provider assessed the patient today and reviewed instructions regarding plan of care.

## 2024-01-06 ENCOUNTER — Telehealth: Payer: Self-pay | Admitting: Podiatry

## 2024-01-06 NOTE — Telephone Encounter (Signed)
 Faxed [facility] [phone] form/office notes  DOS 12/27/23 RTW approx 01/27/24

## 2024-01-14 ENCOUNTER — Ambulatory Visit (INDEPENDENT_AMBULATORY_CARE_PROVIDER_SITE_OTHER)

## 2024-01-14 ENCOUNTER — Encounter: Payer: Self-pay | Admitting: Podiatry

## 2024-01-14 ENCOUNTER — Ambulatory Visit (INDEPENDENT_AMBULATORY_CARE_PROVIDER_SITE_OTHER): Admitting: Podiatry

## 2024-01-14 DIAGNOSIS — M79673 Pain in unspecified foot: Secondary | ICD-10-CM | POA: Insufficient documentation

## 2024-01-14 DIAGNOSIS — B351 Tinea unguium: Secondary | ICD-10-CM | POA: Insufficient documentation

## 2024-01-14 DIAGNOSIS — D239 Other benign neoplasm of skin, unspecified: Secondary | ICD-10-CM | POA: Insufficient documentation

## 2024-01-14 DIAGNOSIS — M2240 Chondromalacia patellae, unspecified knee: Secondary | ICD-10-CM | POA: Insufficient documentation

## 2024-01-14 DIAGNOSIS — Z9889 Other specified postprocedural states: Secondary | ICD-10-CM

## 2024-01-14 DIAGNOSIS — M2021 Hallux rigidus, right foot: Secondary | ICD-10-CM | POA: Insufficient documentation

## 2024-01-14 DIAGNOSIS — R21 Rash and other nonspecific skin eruption: Secondary | ICD-10-CM | POA: Insufficient documentation

## 2024-01-14 DIAGNOSIS — F32A Depression, unspecified: Secondary | ICD-10-CM | POA: Insufficient documentation

## 2024-01-14 DIAGNOSIS — Z860100 Personal history of colon polyps, unspecified: Secondary | ICD-10-CM | POA: Insufficient documentation

## 2024-01-14 DIAGNOSIS — M25561 Pain in right knee: Secondary | ICD-10-CM | POA: Insufficient documentation

## 2024-01-14 DIAGNOSIS — M205X1 Other deformities of toe(s) (acquired), right foot: Secondary | ICD-10-CM

## 2024-01-14 DIAGNOSIS — Z719 Counseling, unspecified: Secondary | ICD-10-CM | POA: Insufficient documentation

## 2024-01-14 DIAGNOSIS — F4323 Adjustment disorder with mixed anxiety and depressed mood: Secondary | ICD-10-CM | POA: Insufficient documentation

## 2024-01-14 DIAGNOSIS — J0100 Acute maxillary sinusitis, unspecified: Secondary | ICD-10-CM | POA: Insufficient documentation

## 2024-01-14 DIAGNOSIS — M1712 Unilateral primary osteoarthritis, left knee: Secondary | ICD-10-CM | POA: Insufficient documentation

## 2024-01-14 DIAGNOSIS — M51379 Other intervertebral disc degeneration, lumbosacral region without mention of lumbar back pain or lower extremity pain: Secondary | ICD-10-CM | POA: Insufficient documentation

## 2024-01-14 DIAGNOSIS — I10 Essential (primary) hypertension: Secondary | ICD-10-CM | POA: Insufficient documentation

## 2024-01-14 DIAGNOSIS — M19079 Primary osteoarthritis, unspecified ankle and foot: Secondary | ICD-10-CM | POA: Insufficient documentation

## 2024-01-14 DIAGNOSIS — M545 Low back pain, unspecified: Secondary | ICD-10-CM | POA: Insufficient documentation

## 2024-01-14 DIAGNOSIS — R000 Tachycardia, unspecified: Secondary | ICD-10-CM | POA: Insufficient documentation

## 2024-01-14 NOTE — Progress Notes (Signed)
 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  He says he feels good not a lot of pain just feels tight.  He presents today wearing his regular shoes and socks  Objective: Vital signs stable alert oriented times 3 sutures are intact states that he remove the bandage last week.  He has mild erythema along the incision site sutures are intact margins well coapted good range of motion of the first metatarsophalangeal joint.  Radiographs taken today demonstrate grommets are intact toes in good position much decrease in edema from previous radiographs.  Assessment: Well-healing surgical foot.  Plan: Allow him to get back to his regular routine like to follow-up with him in about 1 month prior to release.  Sutures were removed and margins remain well coapted.

## 2024-01-20 ENCOUNTER — Telehealth: Payer: Self-pay | Admitting: Podiatry

## 2024-01-20 NOTE — Telephone Encounter (Signed)
 Patient would like a return to work letter to send over to his job stating that he can return on 01/27/2024. The fax number for his FMLA department at his job is [phone].

## 2024-01-21 ENCOUNTER — Encounter: Payer: Self-pay | Admitting: Podiatry

## 2024-01-21 ENCOUNTER — Telehealth: Payer: Self-pay | Admitting: Podiatry

## 2024-01-21 NOTE — Telephone Encounter (Signed)
 Pt req work note for RTW 01/27/24 Faxed to [facility] [employer] [phone]

## 2024-01-21 NOTE — Telephone Encounter (Signed)
 Patient needs work note before Monday, He will stop by today before 5:00 to pick up work note.

## 2024-01-22 NOTE — Telephone Encounter (Signed)
 See prev notes Printed note that was created yesterday and lft for pt to pick up

## 2024-02-06 ENCOUNTER — Encounter: Admitting: Podiatry

## 2024-02-18 ENCOUNTER — Ambulatory Visit: Admitting: Podiatry

## 2024-02-18 ENCOUNTER — Ambulatory Visit (INDEPENDENT_AMBULATORY_CARE_PROVIDER_SITE_OTHER)

## 2024-02-18 ENCOUNTER — Encounter: Payer: Self-pay | Admitting: Podiatry

## 2024-02-18 DIAGNOSIS — Z9889 Other specified postprocedural states: Secondary | ICD-10-CM

## 2024-02-18 DIAGNOSIS — M205X1 Other deformities of toe(s) (acquired), right foot: Secondary | ICD-10-CM

## 2024-02-19 NOTE — Progress Notes (Signed)
 He presents today for his postop visit date of surgery 12/27/2023 [facility] bunion implant right foot.  He states that is doing great he uses a strap to pull the toe up and pull the toe down states that occasionally feels like him standing on a rock on the bottom of the foot but states that for the most part he is doing much better.  States that shoes are much more comfortable.  Still a little tight in his tennis shoes but most likely from swelling he says.  Objective: Vital signs stable alert oriented x 3 there is no erythema mild edema no cellulitic process noted no open lesions or wounds.  Skin is still tight across the incision site most likely some deep scarring is present.  Radiographs taken today do demonstrate an osseously mature individual with implant in good position no bony regrowth there is some more scarring around the medial aspect and dorsal aspect of the joint.  This is visible with increase in density.  Assessment: Well-healing surgical foot.  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  Plan: Allow him to get back to his regular routine he will call me with questions or concerns.
